# Patient Record
Sex: Female | Born: 1965 | Race: White | Hispanic: Yes | Smoking: Never smoker
Health system: Southern US, Community
[De-identification: ages and names within clinical notes are randomized; demographics above are authoritative.]

## PROBLEM LIST (undated history)

## (undated) DIAGNOSIS — E119 Type 2 diabetes mellitus without complications: Secondary | ICD-10-CM

---

## 2017-03-27 ENCOUNTER — Emergency Department
Admission: EM | Admit: 2017-03-27 | Discharge: 2017-03-27 | Disposition: A | Discharge status: Home / Self Care | Payer: Self-pay | Attending: Emergency Medicine | Admitting: Emergency Medicine

## 2017-03-27 ENCOUNTER — Encounter: Payer: Self-pay | Admitting: Emergency Medicine

## 2017-03-27 ENCOUNTER — Other Ambulatory Visit: Payer: Self-pay

## 2017-03-27 ENCOUNTER — Emergency Department: Payer: Self-pay

## 2017-03-27 DIAGNOSIS — R1011 Right upper quadrant pain: Secondary | ICD-10-CM

## 2017-03-27 DIAGNOSIS — N3000 Acute cystitis without hematuria: Secondary | ICD-10-CM | POA: Insufficient documentation

## 2017-03-27 DIAGNOSIS — E119 Type 2 diabetes mellitus without complications: Secondary | ICD-10-CM | POA: Insufficient documentation

## 2017-03-27 DIAGNOSIS — R112 Nausea with vomiting, unspecified: Secondary | ICD-10-CM

## 2017-03-27 HISTORY — DX: Type 2 diabetes mellitus without complications: E11.9

## 2017-03-27 LAB — CBC
HEMATOCRIT: 39.1 % (ref 35.0–47.0)
HEMOGLOBIN: 13.1 g/dL (ref 12.0–16.0)
MCH: 29.3 pg (ref 26.0–34.0)
MCHC: 33.5 g/dL (ref 32.0–36.0)
MCV: 87.5 fL (ref 80.0–100.0)
Platelets: 270 10*3/uL (ref 150–440)
RBC: 4.47 MIL/uL (ref 3.80–5.20)
RDW: 14.1 % (ref 11.5–14.5)
WBC: 10.6 10*3/uL (ref 3.6–11.0)

## 2017-03-27 LAB — COMPREHENSIVE METABOLIC PANEL
ALBUMIN: 3.9 g/dL (ref 3.5–5.0)
ALT: 14 U/L (ref 14–54)
ANION GAP: 9 (ref 5–15)
AST: 19 U/L (ref 15–41)
Alkaline Phosphatase: 87 U/L (ref 38–126)
BUN: 19 mg/dL (ref 6–20)
CHLORIDE: 102 mmol/L (ref 101–111)
CO2: 24 mmol/L (ref 22–32)
Calcium: 8.9 mg/dL (ref 8.9–10.3)
Creatinine, Ser: 0.59 mg/dL (ref 0.44–1.00)
GFR calc non Af Amer: >60 mL/min (ref >60)
GLUCOSE: 142 mg/dL — AB (ref 65–99)
POTASSIUM: 3.7 mmol/L (ref 3.5–5.1)
SODIUM: 135 mmol/L (ref 135–145)
Total Bilirubin: 1 mg/dL (ref 0.3–1.2)
Total Protein: 7.8 g/dL (ref 6.5–8.1)

## 2017-03-27 LAB — URINALYSIS, COMPLETE (UACMP) WITH MICROSCOPIC
Bilirubin Urine: NEGATIVE
GLUCOSE, UA: NEGATIVE mg/dL
HGB URINE DIPSTICK: NEGATIVE
KETONES UR: 20 mg/dL — AB
Nitrite: NEGATIVE
PROTEIN: 30 mg/dL — AB
Specific Gravity, Urine: 1.023 (ref 1.005–1.030)
pH: 5 (ref 5.0–8.0)

## 2017-03-27 LAB — LIPASE, BLOOD: LIPASE: 25 U/L (ref 11–51)

## 2017-03-27 LAB — POCT PREGNANCY, URINE: PREG TEST UR: NEGATIVE

## 2017-03-27 MED ORDER — ONDANSETRON 4 MG PO TBDP: 4.0000 mg | ORAL_TABLET | Freq: Three times a day (TID) | ORAL | 0 refills | Status: AC | PRN

## 2017-03-27 MED ORDER — ONDANSETRON HCL 4 MG/2ML IJ SOLN
4.0000 mg | Freq: Once | INTRAMUSCULAR | Status: AC
  Administered 2017-03-27: 4 mg via INTRAVENOUS
  Filled 2017-03-27: qty 2

## 2017-03-27 MED ORDER — FENTANYL CITRATE (PF) 100 MCG/2ML IJ SOLN
50.0000 ug | Freq: Once | INTRAMUSCULAR | Status: AC
Start: 2017-03-27 — End: 2017-03-27
  Administered 2017-03-27: 50 ug via INTRAVENOUS
  Filled 2017-03-27: qty 2

## 2017-03-27 MED ORDER — SULFAMETHOXAZOLE-TRIMETHOPRIM 400-80 MG PO TABS: 1.0000 | ORAL_TABLET | Freq: Two times a day (BID) | ORAL | 0 refills | Status: AC

## 2017-03-27 MED ORDER — SODIUM CHLORIDE 0.9 % IV BOLUS (SEPSIS)
1000.0000 mL | Freq: Once | INTRAVENOUS | Status: AC
  Administered 2017-03-27: 1000 mL via INTRAVENOUS

## 2017-03-27 MED ORDER — CEFTRIAXONE SODIUM IN DEXTROSE 20 MG/ML IV SOLN
1.0000 g | Freq: Once | INTRAVENOUS | Status: AC
  Administered 2017-03-27: 1 g via INTRAVENOUS
  Filled 2017-03-27: qty 50

## 2017-03-27 NOTE — ED Triage Notes (Signed)
First Nurse Note:  Arrives with c/o N/V onset this morning.  Patient is AAOx3  Skin warm and dry. NAD

## 2017-03-27 NOTE — ED Notes (Signed)
Patient transported to Ultrasound 

## 2017-03-27 NOTE — ED Notes (Signed)
Patient given water at this time.  

## 2017-03-27 NOTE — ED Notes (Signed)
Pt states that the last thing she ate was chicken and soup at 100 am, pt states that at noon today she started having rt upper quad pain and vomiting. Pt states that her pain is getting better, pt states that she does have hr gallbladder.

## 2017-03-27 NOTE — ED Provider Notes (Signed)
[facility] Emergency Department Provider Note  ____________________________________________  Time seen: Approximately 6:17 PM  I have reviewed the triage vital signs and the nursing notes.   HISTORY  Chief Complaint Emesis    HPI Jean Durham is a 52 y.o. female with a history of diabetes, no history of abdominal surgery, presenting with right upper quadrant pain.  The patient reports that she was eating chicken soup and shortly after developed a sharp right upper quadrant pain with at least 10 episodes of vomiting.  She has not had any constipation or diarrhea, fever or chills, or dysuria.  She states that over the past 2-3 years, it is not unusual for her to have a "burning" sensation in the right upper quadrant almost always associated with eating.  She reports undergoing ultrasound in [place] without any positive findings.  Past Medical History:  Diagnosis Date  . Diabetes mellitus without complication (HCC)     There are no active problems to display for this patient.   History reviewed. No pertinent surgical history.    Allergies Patient has no known allergies.  History reviewed. No pertinent family history.  Social History Social History   Tobacco Use  . Smoking status: Never Smoker  . Smokeless tobacco: Never Used  Substance Use Topics  . Alcohol use: No    Frequency: Never  . Drug use: No    Review of Systems Constitutional: No fever/chills.  No lightheadedness or syncope. Eyes: No visual changes. ENT: No sore throat. No congestion or rhinorrhea. Cardiovascular: Denies chest pain. Denies palpitations. Respiratory: Denies shortness of breath.  No cough. Gastrointestinal: Positive right upper quadrant abdominal pain.  Positive nausea, positive vomiting.  No diarrhea.  No constipation. Genitourinary: Negative for dysuria. Musculoskeletal: Negative for back pain. Skin: Negative for rash. Neurological: Negative for headaches. No  focal numbness, tingling or weakness.     ____________________________________________   PHYSICAL EXAM:  VITAL SIGNS: ED Triage Vitals  Enc Vitals Group     BP 03/27/17 1619 138/66     Pulse Rate 03/27/17 1618 73     Resp 03/27/17 1618 20     Temp 03/27/17 1618 97.8 F (36.6 C)     Temp Source 03/27/17 1618 Oral     SpO2 03/27/17 1618 97 %     Weight 03/27/17 1618 144 lb 10 oz (65.6 kg)     Height --      Head Circumference --      Peak Flow --      Pain Score 03/27/17 1617 8     Pain Loc --      Pain Edu? --      Excl. in GC? --     Constitutional: Alert and oriented. Well appearing and in no acute distress. Answers questions appropriately. Eyes: Conjunctivae are normal.  EOMI. No scleral icterus. Head: Atraumatic. Nose: No congestion/rhinnorhea. Mouth/Throat: Mucous membranes are moist.  Neck: No stridor.  Supple.   Cardiovascular: Normal rate, regular rhythm. No murmurs, rubs or gallops.  Respiratory: Normal respiratory effort.  No accessory muscle use or retractions. Lungs CTAB.  No wheezes, rales or ronchi. Gastrointestinal: Soft, and nondistended.  Tender to palpation in the right upper quadrant with a positive Murphy sign.  No guarding or rebound.  No peritoneal signs. Musculoskeletal: No LE edema.  Neurologic:  A&Ox3.  Speech is clear.  Face and smile are symmetric.  EOMI.  Moves all extremities well. Skin:  Skin is warm, dry and intact. No rash noted. Psychiatric: Mood  and affect are normal. Speech and behavior are normal.  Normal judgement.  ____________________________________________   LABS (all labs ordered are listed, but only abnormal results are displayed)  Labs Reviewed  COMPREHENSIVE METABOLIC PANEL - Abnormal; Notable for the following components:      Result Value   Glucose, Bld 142 (*)    All other components within normal limits  URINALYSIS, COMPLETE (UACMP) WITH MICROSCOPIC - Abnormal; Notable for the following components:   Color, Urine  YELLOW (*)    APPearance HAZY (*)    Ketones, ur 20 (*)    Protein, ur 30 (*)    Leukocytes, UA SMALL (*)    Bacteria, UA RARE (*)    Squamous Epithelial / LPF 0-5 (*)    All other components within normal limits  URINE CULTURE  LIPASE, BLOOD  CBC  POC URINE PREG, ED  POCT PREGNANCY, URINE   ____________________________________________  EKG  ED ECG REPORT I, Rockne Menghini, the attending physician, personally viewed and interpreted this ECG.   Date: 03/27/2017  EKG Time: 1853  Rate: 69  Rhythm: normal sinus rhythm  Axis: normal  Intervals:none  ST&T Change: No STEMI  ____________________________________________  RADIOLOGY  US Abdomen Limited Ruq  Result Date: 03/27/2017 CLINICAL DATA:  Abdomen pain since this morning. EXAM: ULTRASOUND ABDOMEN LIMITED RIGHT UPPER QUADRANT COMPARISON:  None. FINDINGS: Gallbladder: No gallstones or wall thickening visualized. No sonographic Murphy sign noted by sonographer. Common bile duct: Diameter: 3.8 mm Liver: No focal lesion identified. Within normal limits in parenchymal echogenicity. Portal vein is patent on color Doppler imaging with normal direction of blood flow towards the liver. IMPRESSION: Normal right upper quadrant ultrasound.  The gallbladder is normal. Electronically Signed   By: Sherian Rein M.D.   On: 03/27/2017 19:46    ____________________________________________   PROCEDURES  Procedure(s) performed: None  Procedures  Critical Care performed: No ____________________________________________   INITIAL IMPRESSION / ASSESSMENT AND PLAN / ED COURSE  Pertinent labs & imaging results that were available during my care of the patient were reviewed by me and considered in my medical decision making (see chart for details).  52 y.o. female with 2-3 years of intermittent mild right upper quadrant pain associated with eating, now with an episode of severe pain associated with multiple episodes of nausea and  vomiting.  Overall, the patient is hemodynamically stable and afebrile.  She does have right upper quadrant pain and her history is suggestive of gallbladder pathology.  We will get an ultrasound of the right upper quadrant and laboratory studies as well as urinalysis.  A screening EKG will be done although this would be atypical for ACS or MI.  Pancreatitis, hepatitis are also in the differential.  Some traumatic treatment will be initiated.  Plan reevaluation for final disposition.  ----------------------------------------- 7:16 PM on 03/27/2017 -----------------------------------------  The patient does have some rare bacteria with small leukocytes in her urine, so have ordered Rocephin for her.  ----------------------------------------- 9:36 PM on 03/27/2017 -----------------------------------------  The patient's workup in the emergency department has been reassuring.  Clinically, she has remained afebrile and hemodynamically stable.  At this time, her pain has completely resolved and she has not had any further vomiting episodes here.  She is able to tolerate clear liquids without difficulty.  Her laboratory studies show normal electrolytes, normal lipase, negative pregnancy test and a normal white blood cell count.  Her urinalysis has rare bacteria and she will be treated as an outpatient with Bactrim for UTI; she did  receive a dose of Rocephin here.  I have discussed the patient's results, the follow-up plan and return precautions and the patient is safe for discharge at this time. ____________________________________________  FINAL CLINICAL IMPRESSION(S) / ED DIAGNOSES  Final diagnoses:  Right upper quadrant pain  Acute cystitis without hematuria  Nausea and vomiting, intractability of vomiting not specified, unspecified vomiting type         NEW MEDICATIONS STARTED DURING THIS VISIT:  New Prescriptions   ONDANSETRON (ZOFRAN ODT) 4 MG DISINTEGRATING TABLET    Take 1 tablet  (4 mg total) by mouth every 8 (eight) hours as needed for nausea or vomiting.   SULFAMETHOXAZOLE-TRIMETHOPRIM (BACTRIM) 400-80 MG TABLET    Take 1 tablet by mouth 2 (two) times daily.      Rockne MenghiniNorman, Anne-Caroline, MD 03/27/17 2138

## 2017-03-27 NOTE — ED Triage Notes (Signed)
Pt c/o vomiting and right side abdominal pain that started this morning.  No known fevers. Has had some chills. No diarrhea.  Unlabored. Color WNL.  VSS.

## 2017-03-27 NOTE — Discharge Instructions (Signed)
Por favor, tome une dieta liquida por 24 horas, y despues empieze con un dieta suave hasta que se siente normal.  Puede tomar Zofran para nausea y vomito.  Regrese a la sala de emergencia para dolor, fiebre, vomito, or cualquier otro sintoma que [employer]'sle moleste.

## 2017-03-29 LAB — URINE CULTURE

## 2018-10-01 ENCOUNTER — Emergency Department (HOSPITAL_COMMUNITY): Payer: Self-pay

## 2018-10-01 ENCOUNTER — Encounter (HOSPITAL_COMMUNITY): Payer: Self-pay | Admitting: Emergency Medicine

## 2018-10-01 ENCOUNTER — Emergency Department (HOSPITAL_COMMUNITY)
Admission: EM | Admit: 2018-10-01 | Discharge: 2018-10-01 | Disposition: A | Discharge status: Home / Self Care | Payer: Self-pay | Attending: Emergency Medicine | Admitting: Emergency Medicine

## 2018-10-01 DIAGNOSIS — R51 Headache: Secondary | ICD-10-CM | POA: Insufficient documentation

## 2018-10-01 DIAGNOSIS — Y939 Activity, unspecified: Secondary | ICD-10-CM | POA: Insufficient documentation

## 2018-10-01 DIAGNOSIS — Y999 Unspecified external cause status: Secondary | ICD-10-CM | POA: Insufficient documentation

## 2018-10-01 DIAGNOSIS — Y929 Unspecified place or not applicable: Secondary | ICD-10-CM | POA: Insufficient documentation

## 2018-10-01 DIAGNOSIS — S01111A Laceration without foreign body of right eyelid and periocular area, initial encounter: Secondary | ICD-10-CM | POA: Insufficient documentation

## 2018-10-01 DIAGNOSIS — M545 Low back pain: Secondary | ICD-10-CM | POA: Insufficient documentation

## 2018-10-01 DIAGNOSIS — M25512 Pain in left shoulder: Secondary | ICD-10-CM | POA: Insufficient documentation

## 2018-10-01 DIAGNOSIS — T1490XA Injury, unspecified, initial encounter: Secondary | ICD-10-CM

## 2018-10-01 MED ORDER — MORPHINE SULFATE (PF) 4 MG/ML IV SOLN
4.0000 mg | Freq: Once | INTRAVENOUS | Status: AC
  Administered 2018-10-01: 4 mg via INTRAVENOUS
  Filled 2018-10-01: qty 1

## 2018-10-01 NOTE — Discharge Instructions (Signed)
Por favor use tylenol y motrin para reducir Psychiatric nurse.  Si continua tener sintomas en sus manos, por favor visite un centro de urgencia.
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
## 2018-10-01 NOTE — ED Notes (Signed)
Patient transported to X-ray 

## 2018-10-01 NOTE — ED Notes (Signed)
Provider at bedside
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
